# Patient Record
Sex: Male | Born: 1975 | Race: White | Hispanic: No | Marital: Married | Smoking: Current every day smoker
Health system: Southern US, Community
[De-identification: ages and names within clinical notes are randomized; demographics above are authoritative.]

## PROBLEM LIST (undated history)

## (undated) DIAGNOSIS — E7800 Pure hypercholesterolemia, unspecified: Secondary | ICD-10-CM

## (undated) DIAGNOSIS — F4310 Post-traumatic stress disorder, unspecified: Secondary | ICD-10-CM

---

## 2018-05-09 ENCOUNTER — Emergency Department (HOSPITAL_BASED_OUTPATIENT_CLINIC_OR_DEPARTMENT_OTHER): Payer: Self-pay

## 2018-05-09 ENCOUNTER — Emergency Department (HOSPITAL_BASED_OUTPATIENT_CLINIC_OR_DEPARTMENT_OTHER)
Admission: EM | Admit: 2018-05-09 | Discharge: 2018-05-09 | Disposition: A | Discharge status: Home / Self Care | Payer: Self-pay | Attending: Emergency Medicine | Admitting: Emergency Medicine

## 2018-05-09 ENCOUNTER — Other Ambulatory Visit: Payer: Self-pay

## 2018-05-09 ENCOUNTER — Encounter (HOSPITAL_BASED_OUTPATIENT_CLINIC_OR_DEPARTMENT_OTHER): Payer: Self-pay | Admitting: Emergency Medicine

## 2018-05-09 DIAGNOSIS — M7122 Synovial cyst of popliteal space [Baker], left knee: Secondary | ICD-10-CM | POA: Insufficient documentation

## 2018-05-09 DIAGNOSIS — Z79899 Other long term (current) drug therapy: Secondary | ICD-10-CM | POA: Insufficient documentation

## 2018-05-09 HISTORY — DX: Post-traumatic stress disorder, unspecified: F43.10

## 2018-05-09 HISTORY — DX: Pure hypercholesterolemia, unspecified: E78.00

## 2018-05-09 MED ORDER — NAPROXEN 500 MG PO TABS: 500.0000 mg | ORAL_TABLET | Freq: Two times a day (BID) | ORAL | 0 refills | Status: AC

## 2018-05-09 NOTE — ED Notes (Signed)
ED Provider at bedside. 

## 2018-05-09 NOTE — ED Provider Notes (Signed)
[facility] EMERGENCY DEPARTMENT Provider Note   CSN: [id number] Arrival date & time: 05/09/18  1310     History   Chief Complaint Chief Complaint  Patient presents with  . Leg Pain    HPI Daniel Cooley is a 42 y.o. male with a hx of PTSD, hypercholesterolemia, and tobacco abuse who presents to the ED with complaints of left calf pain for the past 3 days.  Patient states pain is constant, 8 out of 10 in severity, worse with ambulating and with movement.  Reports associated swelling.  He has tried ibuprofen without significant relief.  Denies fevers, redness, warmth, numbness or tingling.  Denies chest pain or shortness of breath.  HPI  Past Medical History:  Diagnosis Date  . Hypercholesterolemia   . PTSD (post-traumatic stress disorder)     There are no active problems to display for this patient.   History reviewed. No pertinent surgical history.      Home Medications    Prior to Admission medications   Medication Sig Start Date End Date Taking? Authorizing Provider  atorvastatin (LIPITOR) 20 MG tablet TAKE ONE TABLET BY MOUTH AT BEDTIME FOR CHOLESTEROL 12/12/16  Yes [provider]  sertraline (ZOLOFT) 100 MG tablet TAKE TWO TABLETS BY MOUTH DAILY FOR PTSD 01/27/17  Yes [provider]    Family History History reviewed. No pertinent family history.  Social History Social History   Tobacco Use  . Smoking status: Current Every Day Smoker  . Smokeless tobacco: Never Used  Substance Use Topics  . Alcohol use: Yes    Comment: occ  . Drug use: Never     Allergies   Patient has no known allergies.   Review of Systems Review of Systems  Constitutional: Negative for chills and fever.  Respiratory: Negative for shortness of breath.   Cardiovascular: Positive for leg swelling (LLE). Negative for chest pain.  Musculoskeletal: Positive for myalgias (LLE- calf).  Neurological: Negative for weakness and numbness.     Physical  Exam Updated Vital Signs BP (!) 151/85   Pulse 73   Temp 98.5 F (36.9 C) (Oral)   Resp 20   Ht 5\' 8"  (1.727 m)   Wt (!) 140.2 kg   SpO2 99%   BMI 46.98 kg/m   Physical Exam  Constitutional: He appears well-developed and well-nourished. No distress.  HENT:  Head: Normocephalic and atraumatic.  Eyes: Conjunctivae are normal. Right eye exhibits no discharge. Left eye exhibits no discharge.  Cardiovascular: Normal rate and regular rhythm.  Pulses:      Dorsalis pedis pulses are 2+ on the right side, and 2+ on the left side.       Posterior tibial pulses are 2+ on the right side, and 2+ on the left side.  Pulmonary/Chest: Effort normal and breath sounds normal.  Musculoskeletal:  Lower extremities: Patient has 1+ pitting edema to the left lower leg. He is also noted to have some varicose vein changes to the left lower leg.  No overlying erythema or warmth.  No obvious deformities.  He has normal range of motion to bilateral hips, knees, ankles, and all digits.  He is tender to palpation to the posterior aspect of the calf.  There are no point/focal bony areas of tenderness.  Compartments are soft.  Neurovascularly intact distally.  Neurological: He is alert.  Clear speech.  Sensation grossly intact bilateral lower extremities.  5 out of 5 strength plantar dorsiflexion bilaterally.  Gait is steady and intact.  Psychiatric:  He has a normal mood and affect. His behavior is normal. Thought content normal.  Nursing note and vitals reviewed.   ED Treatments / Results  Labs (all labs ordered are listed, but only abnormal results are displayed) Labs Reviewed - No data to display  EKG None  Radiology [place] Venous Img Lower Unilateral Left  Result Date: 05/09/2018 CLINICAL DATA:  Left calf pain and swelling. EXAM: LOWER EXTREMITY VENOUS DOPPLER ULTRASOUND TECHNIQUE: Gray-scale sonography with graded compression, as well as color Doppler and duplex ultrasound were performed to evaluate the  lower extremity deep venous systems from the level of the common femoral vein and including the common femoral, femoral, profunda femoral, popliteal and calf veins including the posterior tibial, peroneal and gastrocnemius veins when visible. The superficial great saphenous vein was also interrogated. Spectral Doppler was utilized to evaluate flow at rest and with distal augmentation maneuvers in the common femoral, femoral and popliteal veins. COMPARISON:  None. FINDINGS: Contralateral Common Femoral Vein: Respiratory phasicity is normal and symmetric with the symptomatic side. No evidence of thrombus. Normal compressibility. Common Femoral Vein: No evidence of thrombus. Normal compressibility, respiratory phasicity and response to augmentation. Saphenofemoral Junction: No evidence of thrombus. Normal compressibility and flow on color Doppler imaging. Profunda Femoral Vein: No evidence of thrombus. Normal compressibility and flow on color Doppler imaging. Femoral Vein: No evidence of thrombus. Normal compressibility, respiratory phasicity and response to augmentation. Popliteal Vein: No evidence of thrombus. Normal compressibility, respiratory phasicity and response to augmentation. Calf Veins: Limited visualization of the posterior tibial vein. Nonvisualization of the peroneal vein. No evidence of thrombus in the posterior tibial vein. Normal compressibility and flow on color Doppler imaging. Superficial Great Saphenous Vein: No evidence of thrombus. Normal compressibility. Venous Reflux:  None. Other Findings: 13.3 x 2.1 x 5.5 cm avascular complex heterogeneous fluid collection in the medial popliteal fossa most consistent with a Baker's cyst extending into the calf. Complex heterogeneous aspect of Baker's cyst suggests a hemorrhagic Baker's cyst. IMPRESSION: No evidence of deep venous thrombosis. Hemorrhagic Baker's cyst extending towards the calf. Electronically Signed   By: Elige KoHetal  Patel   On: 05/09/2018 15:15      Procedures Procedures (including critical care time)  Medications Ordered in ED Medications - No data to display   Initial Impression / Assessment and Plan / ED Course  I have reviewed the triage vital signs and the nursing notes.  Pertinent labs & imaging results that were available during my care of the patient were reviewed by me and considered in my medical decision making (see chart for details).   Patient presents to the emergency department with calf pain and swelling.  Patient nontoxic-appearing, notably hypertensive, doubt HTN emergency, patient aware of need for recheck, improved throughout visit.  Overlying erythema, warmth, or fevers to raise concern for infectious etiology such as cellulitis, ostium mellitus, or septic joint.  Venous duplex obtained per triage negative for DVT. US does show evidence of hemorrhagic Baker's cyst extending towards the calf which I suspect as cause of patient's symptoms.  NVI distally with soft compartments. Will treat with naproxen with sports medicine follow-up.I discussed results, treatment plan, need for follow-up, and return precautions with the patient. Provided opportunity for questions, patient confirmed understanding and is in agreement with plan.   Vitals:   05/09/18 1500 05/09/18 1612  BP: (!) 144/85 138/86  Pulse: 73 66  Resp: 18 18  Temp:    SpO2: 100% 98%   Final Clinical Impressions(s) / ED Diagnoses  Final diagnoses:  Baker cyst, left    ED Discharge Orders         Ordered    naproxen (NAPROSYN) 500 MG tablet  2 times daily     05/09/18 1557           Aamori Mcmasters, Pleas Koch, PA-C 05/09/18 1626    Pricilla Loveless, MD 05/09/18 1908

## 2018-05-09 NOTE — ED Triage Notes (Signed)
Patient states that he has had pain to his left calf for the last 2 days

## 2018-05-09 NOTE — Discharge Instructions (Addendum)
You are seen in the emergency department today for left calf pain and swelling.  Your ultrasound did not show a blood clot.  Your ultrasound did show that you have a hemorrhagic Baker's cyst, see the attached handout regarding further information of this diagnosis.  We are treating this with naproxen. - Naproxen is a nonsteroidal anti-inflammatory medication that will help with pain and swelling. Be sure to take this medication as prescribed with food, 1 pill every 12 hours,  It should be taken with food, as it can cause stomach upset, and more seriously, stomach bleeding. Do not take other nonsteroidal anti-inflammatory medications with this such as Advil, Motrin, Aleve, Mobic, Goodie Powder, or Motrin.    You make take Tylenol per over the counter dosing with these medications.   We have prescribed you new medication(s) today. Discuss the medications prescribed today with your pharmacist as they can have adverse effects and interactions with your other medicines including over the counter and prescribed medications. Seek medical evaluation if you start to experience new or abnormal symptoms after taking one of these medicines, seek care immediately if you start to experience difficulty breathing, feeling of your throat closing, facial swelling, or rash as these could be indications of a more serious allergic reaction  Please follow-up with the sports medicine doctor providing your discharge instructions within 1 week.  Return to the ER for new or worsening symptoms including but not limited to worsening swelling, increased pain, numbness, weakness, tingling, discoloration of your lower leg or foot, or any other concerns.

## 2018-05-09 NOTE — ED Notes (Signed)
Family member noted to be playing with the monitoring equipment.

## 2019-09-18 IMAGING — US US EXTREM LOW VENOUS*L*
1 series · 13 of 24 positions shown · non-contrast
Comparison: None.

CLINICAL DATA: Left calf pain and swelling.

EXAM:
LOWER EXTREMITY VENOUS DOPPLER ULTRASOUND
TECHNIQUE: Gray-scale sonography with graded compression, as well as color
Doppler and duplex ultrasound were performed to evaluate the lower
extremity deep venous systems from the level of the common femoral
vein and including the common femoral, femoral, profunda femoral,
popliteal and calf veins including the posterior tibial, peroneal
and gastrocnemius veins when visible. The superficial great
saphenous vein was also interrogated. Spectral Doppler was utilized
to evaluate flow at rest and with distal augmentation maneuvers in
the common femoral, femoral and popliteal veins.

[Series 1: us extrem low venous*left* · 0.08mm/px · 13 of 42 slices shown]
[im 1/42]
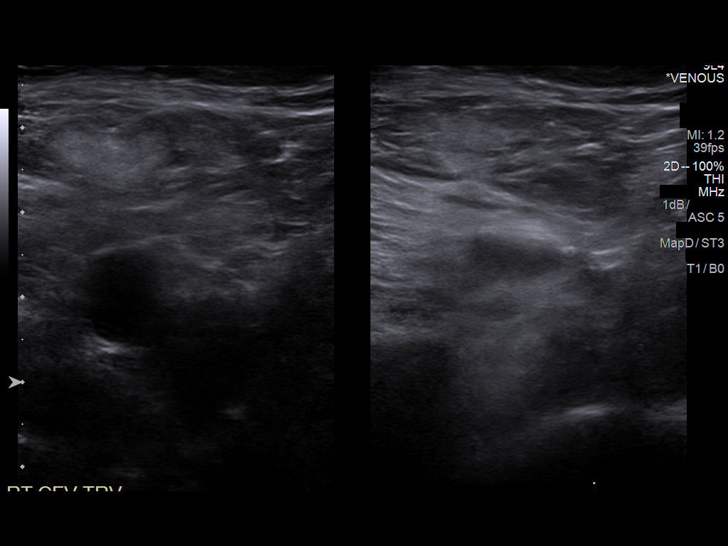
[im 4/42]
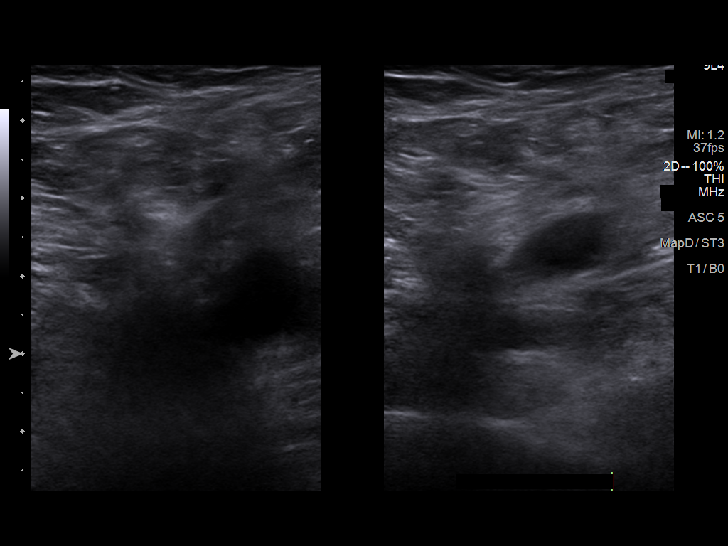
[im 8/42]
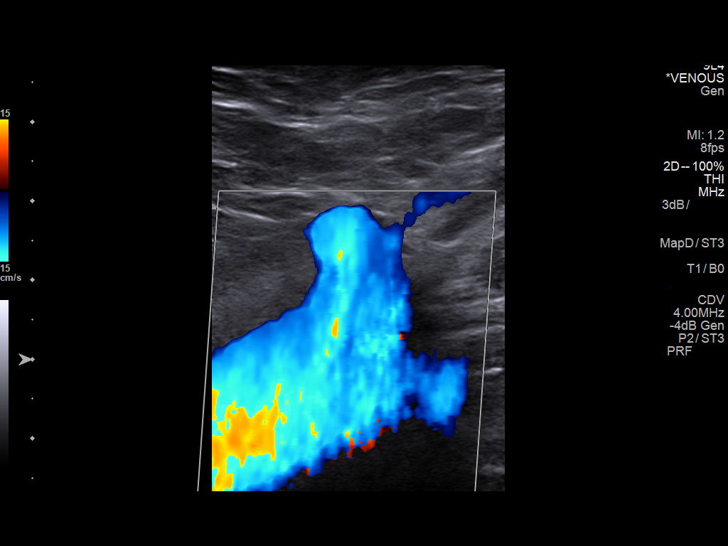
[im 11/42]
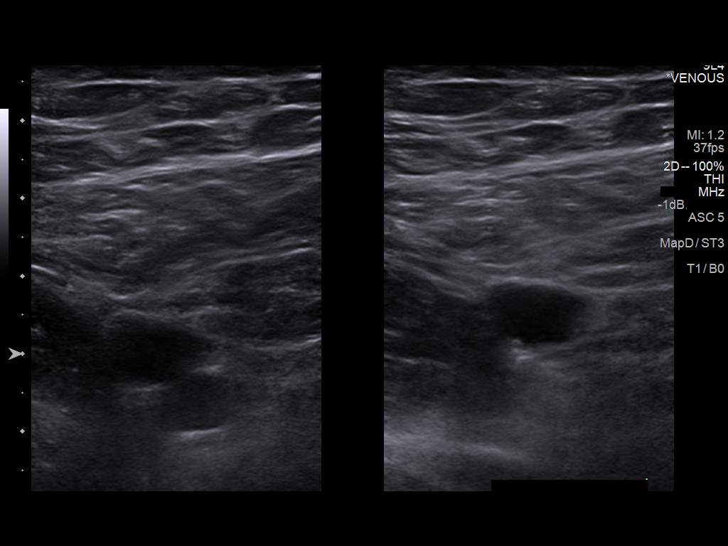
[im 15/42]
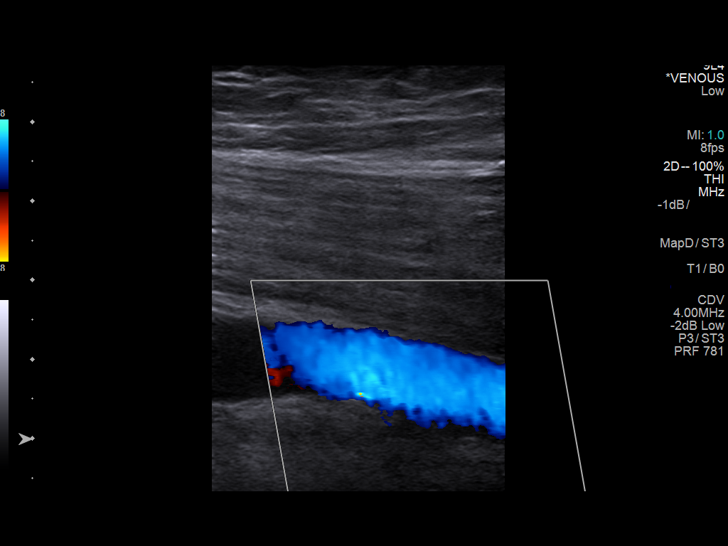
[im 18/42]
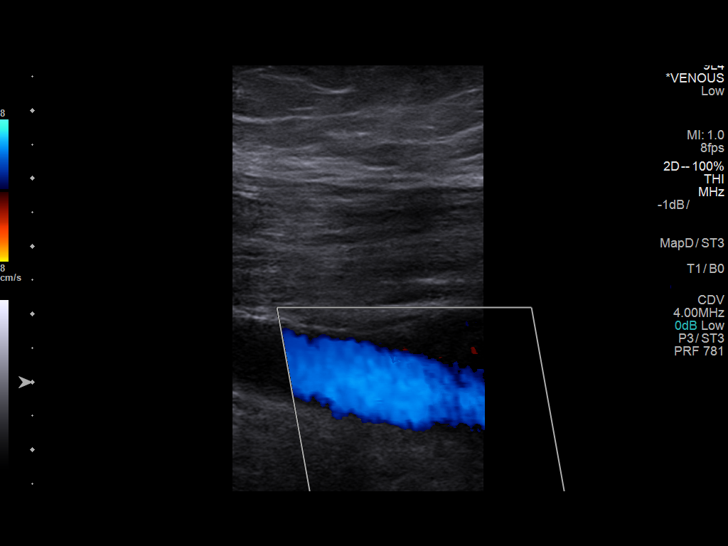
[im 22/42]
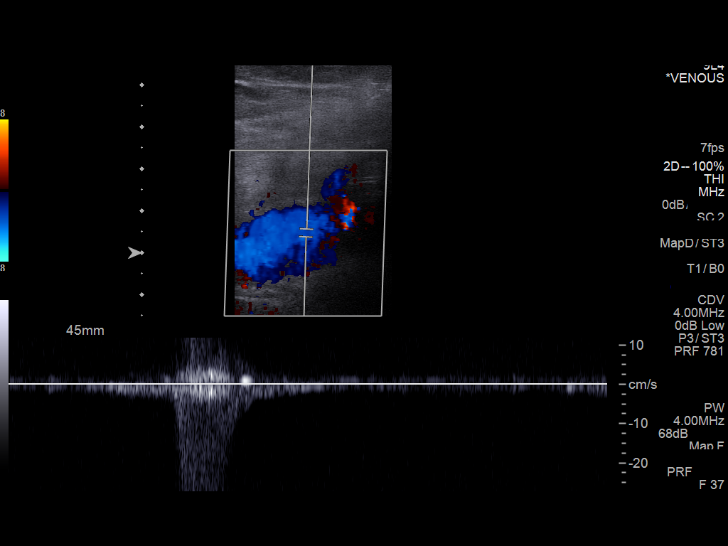
[im 24/42]
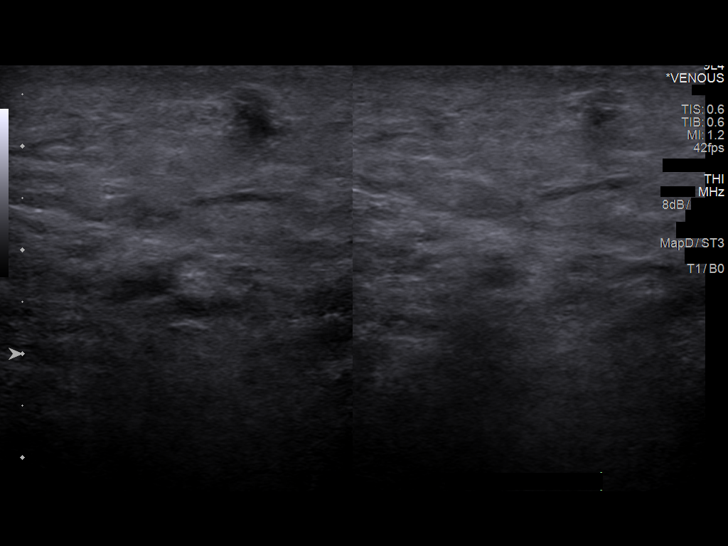
[im 27/42]
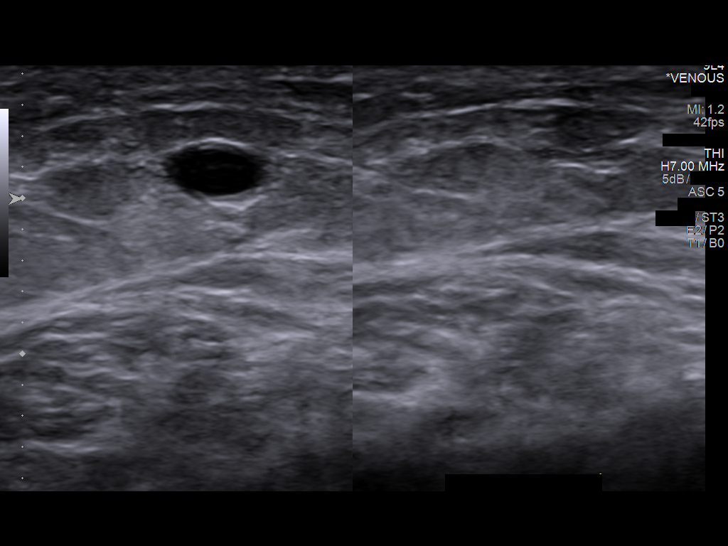
[im 31/42]
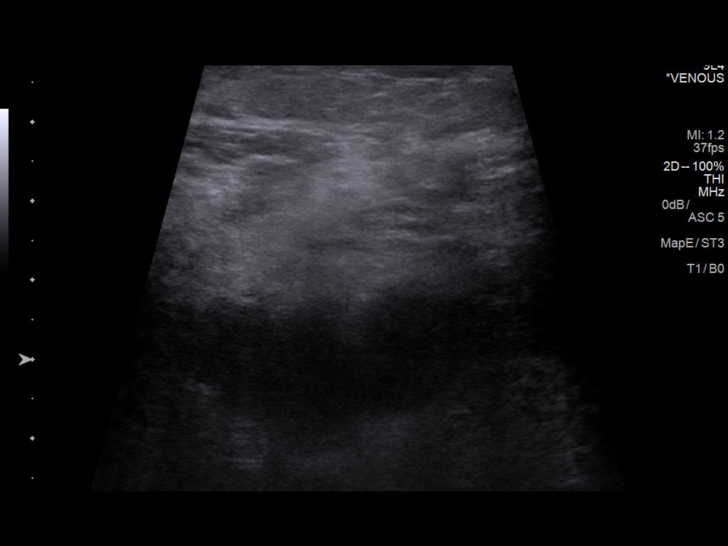
[im 34/42]
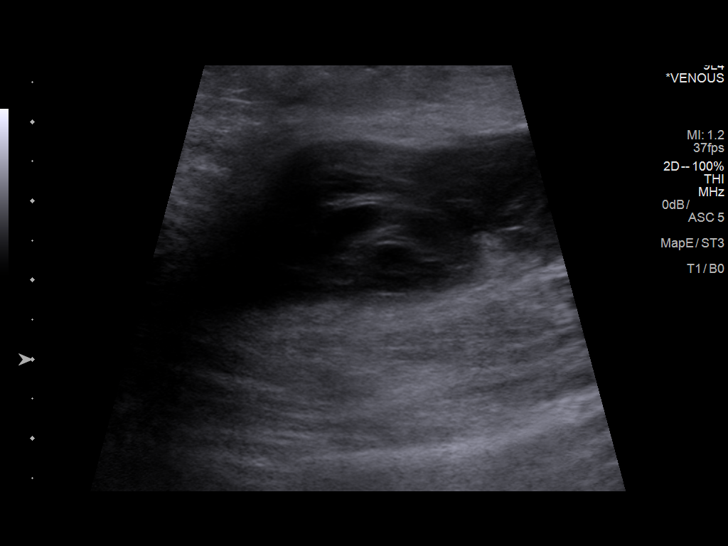
[im 38/42]
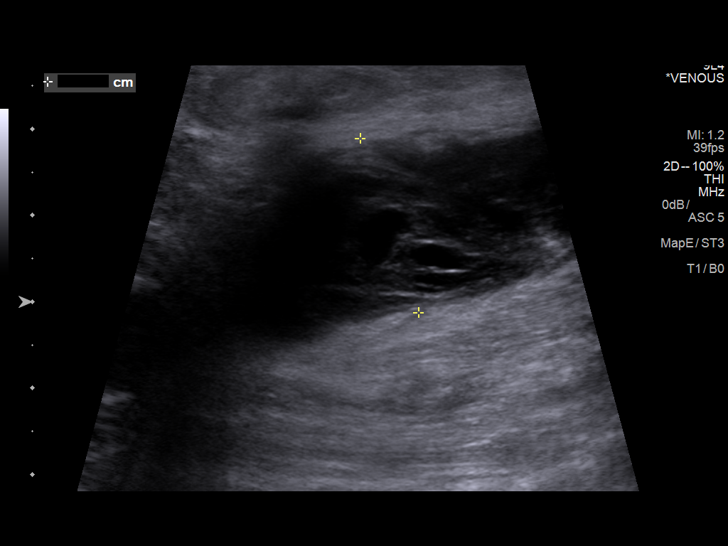
[im 42/42]
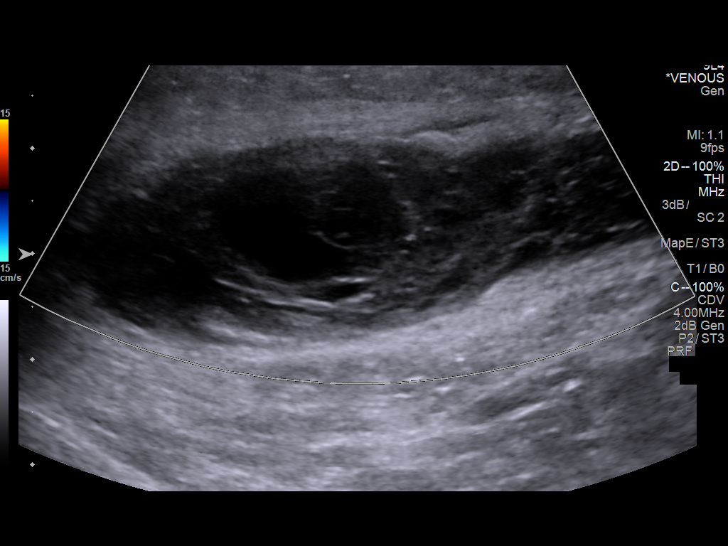

[13 of 24 positions shown; findings below may reference images not displayed]

FINDINGS: Contralateral Common Femoral Vein: Respiratory phasicity is normal
and symmetric with the symptomatic side. No evidence of thrombus.
Normal compressibility.

Common Femoral Vein: No evidence of thrombus. Normal
compressibility, respiratory phasicity and response to augmentation.

Saphenofemoral Junction: No evidence of thrombus. Normal
compressibility and flow on color Doppler imaging.

Profunda Femoral Vein: No evidence of thrombus. Normal
compressibility and flow on color Doppler imaging.

Femoral Vein: No evidence of thrombus. Normal compressibility,
respiratory phasicity and response to augmentation.

Popliteal Vein: No evidence of thrombus. Normal compressibility,
respiratory phasicity and response to augmentation.

Calf Veins: Limited visualization of the posterior tibial vein.
Nonvisualization of the peroneal vein. No evidence of thrombus in
the posterior tibial vein. Normal compressibility and flow on color
Doppler imaging.

Superficial Great Saphenous Vein: No evidence of thrombus. Normal
compressibility.

Venous Reflux:  None.

Other Findings: 13.3 x 2.1 x 5.5 cm avascular complex heterogeneous
fluid collection in the medial popliteal fossa most consistent with
a Baker's cyst extending into the calf. Complex heterogeneous aspect
of Baker's cyst suggests a hemorrhagic Baker's cyst.
IMPRESSION: No evidence of deep venous thrombosis.

Hemorrhagic Baker's cyst extending towards the calf.
